# Patient Record
Sex: Male | Born: 1985 | Race: Black or African American | Hispanic: No | Marital: Single | Smoking: Never smoker
Health system: Southern US, Community
[De-identification: ages and names within clinical notes are randomized; demographics above are authoritative.]

## PROBLEM LIST (undated history)

## (undated) DIAGNOSIS — A539 Syphilis, unspecified: Secondary | ICD-10-CM

---
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
## 2005-01-11 ENCOUNTER — Emergency Department (HOSPITAL_COMMUNITY): Admission: EM | Admit: 2005-01-11 | Discharge: 2005-01-11 | Payer: Self-pay | Admitting: Family Medicine

## 2007-03-01 ENCOUNTER — Emergency Department (HOSPITAL_COMMUNITY): Admission: EM | Admit: 2007-03-01 | Discharge: 2007-03-01 | Payer: Self-pay | Admitting: Emergency Medicine

## 2007-03-02 ENCOUNTER — Emergency Department (HOSPITAL_COMMUNITY): Admission: EM | Admit: 2007-03-02 | Discharge: 2007-03-03 | Payer: Self-pay | Admitting: Emergency Medicine
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
## 2010-04-12 ENCOUNTER — Emergency Department (HOSPITAL_COMMUNITY)
Admission: EM | Admit: 2010-04-12 | Discharge: 2010-04-12 | Disposition: A | Discharge status: Home / Self Care | Payer: Self-pay | Attending: Emergency Medicine | Admitting: Emergency Medicine

## 2010-04-12 DIAGNOSIS — R6883 Chills (without fever): Secondary | ICD-10-CM | POA: Insufficient documentation

## 2010-04-12 DIAGNOSIS — R197 Diarrhea, unspecified: Secondary | ICD-10-CM | POA: Insufficient documentation

## 2010-04-12 DIAGNOSIS — R110 Nausea: Secondary | ICD-10-CM | POA: Insufficient documentation

## 2010-04-12 DIAGNOSIS — B9789 Other viral agents as the cause of diseases classified elsewhere: Secondary | ICD-10-CM | POA: Insufficient documentation

## 2014-10-02 ENCOUNTER — Encounter (HOSPITAL_COMMUNITY): Payer: Self-pay | Admitting: General Practice

## 2014-10-02 ENCOUNTER — Emergency Department (HOSPITAL_COMMUNITY)
Admission: EM | Admit: 2014-10-02 | Discharge: 2014-10-02 | Disposition: A | Discharge status: Home / Self Care | Payer: Worker's Compensation | Attending: Emergency Medicine | Admitting: Emergency Medicine

## 2014-10-02 DIAGNOSIS — T304 Corrosion of unspecified body region, unspecified degree: Secondary | ICD-10-CM

## 2014-10-02 DIAGNOSIS — Y998 Other external cause status: Secondary | ICD-10-CM | POA: Insufficient documentation

## 2014-10-02 DIAGNOSIS — Y9289 Other specified places as the place of occurrence of the external cause: Secondary | ICD-10-CM | POA: Insufficient documentation

## 2014-10-02 DIAGNOSIS — T6591XA Toxic effect of unspecified substance, accidental (unintentional), initial encounter: Secondary | ICD-10-CM | POA: Insufficient documentation

## 2014-10-02 DIAGNOSIS — W38XXXA Explosion and rupture of other specified pressurized devices, initial encounter: Secondary | ICD-10-CM | POA: Diagnosis not present

## 2014-10-02 DIAGNOSIS — T20411A Corrosion of unspecified degree of right ear [any part, except ear drum], initial encounter: Secondary | ICD-10-CM | POA: Diagnosis present

## 2014-10-02 DIAGNOSIS — T22512A Corrosion of first degree of left forearm, initial encounter: Secondary | ICD-10-CM | POA: Insufficient documentation

## 2014-10-02 DIAGNOSIS — Z23 Encounter for immunization: Secondary | ICD-10-CM | POA: Insufficient documentation

## 2014-10-02 DIAGNOSIS — Y9389 Activity, other specified: Secondary | ICD-10-CM | POA: Insufficient documentation

## 2014-10-02 MED ORDER — OXYCODONE-ACETAMINOPHEN 5-325 MG PO TABS: 1.0000 | ORAL_TABLET | ORAL | Status: AC | PRN

## 2014-10-02 MED ORDER — TETANUS-DIPHTH-ACELL PERTUSSIS 5-2.5-18.5 LF-MCG/0.5 IM SUSP
0.5000 mL | Freq: Once | INTRAMUSCULAR | Status: AC
  Administered 2014-10-02: 0.5 mL via INTRAMUSCULAR
  Filled 2014-10-02: qty 0.5

## 2014-10-02 MED ORDER — OXYCODONE-ACETAMINOPHEN 5-325 MG PO TABS
2.0000 | ORAL_TABLET | Freq: Once | ORAL | Status: AC
  Administered 2014-10-02: 2 via ORAL
  Filled 2014-10-02: qty 2

## 2014-10-02 MED ORDER — DOUBLE ANTIBIOTIC 500-10000 UNIT/GM EX OINT
TOPICAL_OINTMENT | Freq: Two times a day (BID) | CUTANEOUS | Status: DC
  Filled 2014-10-02 (×18): qty 1

## 2014-10-02 MED ORDER — DOUBLE ANTIBIOTIC 500-10000 UNIT/GM EX OINT
TOPICAL_OINTMENT | Freq: Two times a day (BID) | CUTANEOUS | Status: DC
  Administered 2014-10-02: 1 via TOPICAL
  Filled 2014-10-02: qty 14.17
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
## 2014-10-02 NOTE — Discharge Instructions (Signed)
Please follow-up with your work physician or private physician for recheck in 2 days.  Chemical Burn Many chemicals can burn the skin. A chemical burn should be flushed with cool water and checked by an emergency caregiver. Your skin is a natural barrier to infection. It is the largest organ of your body. Burns damage this natural protection. To help prevent infection, it is very important to follow your caregiver's instructions in the care of your burn.  Many industrial chemicals may cause burns. These chemicals include acids, alkalis, and organic compounds such as petroleum, phenol, bitumen, tar, and grease. When acids come in contact with the skin, they cause an immediate change in the skin.Acid burns produce significant pain and form a scab (eschar). Usually, the immediate skin changes are the only damage from an acid burn.However, exposure to formic acid, chromic acid, or hydrofluoric acid may affect the whole body and may even be life-threatening. Alkalis include lye, cement, lime, and many chemicals with "hydroxide" in their name.An alkali burn may be less apparent than an acid burn at first. However, alkalis may cause greater tissue damage.It is important to be aware of any chemicals you are using. Treat any exposure to skin, eyes, or mucous membranes (nose, mouth, throat) as a potential emergency. PREVENTION  Avoid exposure to toxic chemicals that can cause burns.  Store chemicals out of the reach of children.  Use protective gloves when handling dangerous chemicals. HOME CARE INSTRUCTIONS   Wash your hands well before changing your bandage.  Change your bandage as often as directed by your caregiver.  Remove the old bandage. If the bandage sticks, you may soak it off with cool, clean water.  Cleanse the burn thoroughly but gently with mild soap and water.  Pat the area dry with a clean, dry cloth.  Apply a thin layer of antibacterial cream to the burn.  Apply a clean bandage  as instructed by your caregiver.  Keep the bandage as clean and dry as possible.  Elevate the affected area for the first 24 hours, then as instructed by your caregiver.  Only take over-the-counter or prescription medicines for pain, discomfort, or fever as directed by your caregiver.  Keep all follow-up appointments.This is important. This is how your caregiver can tell if your treatment is working. SEEK IMMEDIATE MEDICAL CARE IF:   You develop excessive pain.  You develop redness, tenderness, swelling, or red streaks near the burn.  The burned area develops yellowish-white fluid (pus) or a bad smell.  You have a fever. MAKE SURE YOU:   Understand these instructions.  Will watch your condition.  Will get help right away if you are not doing well or get worse. Document Released: 10/09/2003 Document Revised: 03/27/2011 Document Reviewed: 05/30/2010 [facility] Patient Information 2015 [place], [place]. This information is not intended to replace advice given to you by your health care provider. Make sure you discuss any questions you have with your health care provider.
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
## 2015-12-18 ENCOUNTER — Emergency Department (HOSPITAL_COMMUNITY): Payer: Self-pay

## 2015-12-18 ENCOUNTER — Encounter (HOSPITAL_COMMUNITY): Payer: Self-pay | Admitting: Nurse Practitioner

## 2015-12-18 ENCOUNTER — Emergency Department (HOSPITAL_COMMUNITY)
Admission: EM | Admit: 2015-12-18 | Discharge: 2015-12-18 | Disposition: A | Discharge status: Home / Self Care | Payer: Self-pay | Attending: Emergency Medicine | Admitting: Emergency Medicine

## 2015-12-18 DIAGNOSIS — F419 Anxiety disorder, unspecified: Secondary | ICD-10-CM | POA: Insufficient documentation

## 2015-12-18 DIAGNOSIS — Z79899 Other long term (current) drug therapy: Secondary | ICD-10-CM | POA: Insufficient documentation

## 2015-12-18 HISTORY — DX: Syphilis, unspecified: A53.9

## 2015-12-18 LAB — CBC WITH DIFFERENTIAL/PLATELET
BASOS ABS: 0.1 10*3/uL (ref 0.0–0.1)
Basophils Relative: 1 %
Eosinophils Absolute: 0.5 10*3/uL (ref 0.0–0.7)
Eosinophils Relative: 7 %
HEMATOCRIT: 45.9 % (ref 39.0–52.0)
HEMOGLOBIN: 15.9 g/dL (ref 13.0–17.0)
LYMPHS PCT: 49 %
Lymphs Abs: 3.1 10*3/uL (ref 0.7–4.0)
MCH: 30.9 pg (ref 26.0–34.0)
MCHC: 34.6 g/dL (ref 30.0–36.0)
MCV: 89.3 fL (ref 78.0–100.0)
MONO ABS: 0.7 10*3/uL (ref 0.1–1.0)
MONOS PCT: 11 %
NEUTROS ABS: 2.1 10*3/uL (ref 1.7–7.7)
NEUTROS PCT: 32 %
Platelets: 298 10*3/uL (ref 150–400)
RBC: 5.14 MIL/uL (ref 4.22–5.81)
RDW: 13.2 % (ref 11.5–15.5)
WBC: 6.4 10*3/uL (ref 4.0–10.5)

## 2015-12-18 LAB — I-STAT TROPONIN, ED: Troponin i, poc: 0 ng/mL (ref 0.00–0.08)

## 2015-12-18 LAB — BASIC METABOLIC PANEL
ANION GAP: 7 (ref 5–15)
BUN: 9 mg/dL (ref 6–20)
CO2: 29 mmol/L (ref 22–32)
Calcium: 9.2 mg/dL (ref 8.9–10.3)
Chloride: 103 mmol/L (ref 101–111)
Creatinine, Ser: 1.17 mg/dL (ref 0.61–1.24)
GLUCOSE: 96 mg/dL (ref 65–99)
POTASSIUM: 4.2 mmol/L (ref 3.5–5.1)
Sodium: 139 mmol/L (ref 135–145)

## 2015-12-18 MED ORDER — LORAZEPAM 0.5 MG PO TABS
0.5000 mg | ORAL_TABLET | Freq: Once | ORAL | Status: AC
  Administered 2015-12-18: 0.5 mg via ORAL
  Filled 2015-12-18: qty 1

## 2015-12-18 MED ORDER — LORAZEPAM 1 MG PO TABS: 0.5000 mg | ORAL_TABLET | Freq: Three times a day (TID) | ORAL | 0 refills | Status: DC | PRN

## 2015-12-18 MED ORDER — LORAZEPAM 1 MG PO TABS: 0.5000 mg | ORAL_TABLET | Freq: Three times a day (TID) | ORAL | 0 refills | Status: AC | PRN

## 2015-12-18 NOTE — ED Notes (Signed)
Patient d/c'd self care.  F/U and medications reviewed.  Patient verbalized understanding. 

## 2015-12-18 NOTE — Discharge Instructions (Signed)
Therapists  Name Address  Phone Number  [facility] [address] [place] [phone]  [facility]  [address] [place] [phone]  [facility]  [address] [phone]  [facility] [address] [place] [phone]  [facility]  [address], [address] [place] [phone]  [facility] [address], [address] [phone]  [facility] (sliding Fee) [address] [place] [phone]  [facility] Maris BergerKathy Kirstner [address], [place] [phone]  [facility] (sliding fee) [address] [facility] [phone]  [facility] [address], [place] [phone]  [facility] [address], Emelda FearFerguson Building, [place] [phone]  Arbutus PedJoe Alvarez, LCSW [address], [place] [phone]  Shanon RosserBarbara Farran, [place] [address] [phone]  Vernell LeepKen Frazier, [facility]  [phone], [place] [phone]  Hurley CiscoBarbara Fousek [address] [place] [phone]  Marvene Staffarryl Hyers, PhD [address] [place] [phone]  Arrie SenateNancy King, LSW, LCAS [address] [facility]  [phone]  Windee Knox-Heitkamp [address], [place] [phone]  Lyanne CoEd Lurey, PhD [facility] [address], [place] [phone]  Glendell DockerMcMillan, Matthew, [facility], LCAS [address] [place] [phone]  Ernest McCoy, LelyLCSW, CSAC [address] [place] [phone]  Harle StanfordBarbara Metz, PhD [facility]  [address] [place] [phone]  [facility] Milan, LCSW [address] [place] [phone]  Horace PorteousGlen Newsom, PhD  [phone]  Para Marchick Pace, EDD, LMFT [facility]  [address] [place] [phone]  Evalina FieldJane Perrin, PhD  [phone], [address] [place] [phone]  Isaiah SergeJim Scherer [phone] [place] [phone]  Kem ParkinsonPatty Von Steen, PhD [address] [phone] [place] [phone]               [facility]  [facility]  [address] [place] [phone]  Miguel AschoffElaine Talbert, PhD [address], [place] [phone]  Caralyn Guileavid Gutterman [address], [place] [phone]      Psychiatrists  Name Address Phone Number Fax Number  [facility]  [address] # [id number], [place] [phone]  [facility] [address], [place] [phone]  [facility] [address], [place] [phone]   [facility] [address], [place] [phone]   [facility] [address], [place] [phone]   Dr. Leata MouseJanardhana Jonnalagadda [address], [address], [place] [phone]  [facility]  [address] [place] [phone]   Andee PolesParish McKinney, MD [address], [address], [place] [phone]  Len Blalockavid Fuller, MD [address], [place], [place] [phone]   Lerry LinerStephen Sanders, MD [address] [place] [phone]   Mar Daringobert Wangelin MD [address] [place] [phone]   Ellamae SiaKeshavpal Reddy  MD [address] [place] [phone]  Ezzard Flaxarol Sena, MD [address] [place] [phone]   [address] [place] [phone]   Phillip HealJane Steiner, MD [address], [phone] [facility] [phone]                   [facility]  [facility] The [facility] [facility], [address] [place] CannerGBO [phone]  [facility]  [address], [place] [phone]  [facility] [phone]

## 2015-12-18 NOTE — ED Triage Notes (Signed)
Pt c/o a sudden onset of right sided sharp chest pain radiating to the right shoulder. Denies cardiopulmonary hx or any other significant medical hx.

## 2015-12-18 NOTE — ED Provider Notes (Signed)
[facility]-EMER[facility]Y DEPT Provider Note   CSN: [id number] Arrival date & time: 12/18/15  1935  By signing my name below, I, Dan Leach, attest that this documentation has been prepared under the direction and in the presence of Dan Peliffany Revis Whalin, PA-C. Electronically Signed: Doreatha MartinEva Leach, ED Scribe. 12/18/15. 8:59 PM.    History   Chief Complaint Chief Complaint  Patient presents with  . Chest Pain    HPI Dan Leach is a 30 y.o. male who presents to the Emergency Department complaining of an episode of moderate, sharp right-sided CP with radiation to the right shoulder that began this evening and lasted 10 seconds. Per pt, his pain was rated as a 7/10 when it began, and has now resolved. Pt states he initially experienced an episode of shaking to his torso and hands yesterday while at a work orientation, which recurred this evening just prior to onset of CP. Pt reports no h/o similar shaking. He denies excessive caffeine consumption or sudden cessation of caffeine. He does note some anxiety related to his recent separation with his wife. The patient becomes tearful during the conversation and admits that he is under a lot of stress and him and his separated wife are trying to resolve issues. He has not had any SOB, back pain, diaphoresis, syncope, weakness, fevers, URI symptoms, LE swelling. He denies SI, HI.   The history is provided by the patient. No language interpreter was used.    Past Medical History:  Diagnosis Date  . Syphilis     There are no active problems to display for this patient.   History reviewed. No pertinent surgical history.     Home Medications    Prior to Admission medications   Medication Sig Start Date End Date Taking? Authorizing Provider  LORazepam (ATIVAN) 1 MG tablet Take 0.5-1 tablets (0.5-1 mg total) by mouth 3 (three) times daily as needed for anxiety. 12/18/15   Dan Peliffany Iktan Aikman, PA-C  oxyCODONE-acetaminophen (PERCOCET/ROXICET) 5-325 MG per tablet Take  1 tablet by mouth every 4 (four) hours as needed for severe pain. Patient not taking: Reported on 12/18/2015 10/02/14   [name], MD    Family History History reviewed. No pertinent family history.  Social History Social History  Substance Use Topics  . Smoking status: Never Smoker  . Smokeless tobacco: Never Used  . Alcohol use No     Allergies   Patient has no known allergies.   Review of Systems Review of Systems  Constitutional:       +shaking  Cardiovascular: Positive for chest pain.  Psychiatric/Behavioral: Negative for suicidal ideas. The patient is nervous/anxious.   All other systems reviewed and are negative.   Physical Exam Updated Vital Signs BP 134/92 (BP Location: Left Arm)   Pulse (!) 55   Temp 97.9 F (36.6 C) (Oral)   Resp 18   SpO2 98%   Physical Exam  Constitutional: He appears well-developed and well-nourished. No distress.  HENT:  Head: Normocephalic and atraumatic.  Right Ear: Tympanic membrane and ear canal normal.  Left Ear: Tympanic membrane and ear canal normal.  Nose: Nose normal.  Mouth/Throat: Uvula is midline, oropharynx is clear and moist and mucous membranes are normal.  Eyes: Conjunctivae are normal. Pupils are equal, round, and reactive to light.  Neck: Normal range of motion. Neck supple.  Cardiovascular: Normal rate and regular rhythm.   Pulmonary/Chest: Effort normal. No respiratory distress.  Abdominal: Soft. He exhibits no distension.  No signs of abdominal distention  Musculoskeletal: Normal  range of motion.  No LE swelling  Neurological: He is alert.  Acting at baseline  Skin: Skin is warm and dry. No rash noted.  Psychiatric: His behavior is normal. His mood appears anxious. He exhibits a depressed mood.  + tearful. Tremulous resolved after Ativan.   Nursing note and vitals reviewed.    ED Treatments / Results   DIAGNOSTIC STUDIES: Oxygen Saturation is 98% on RA, normal by my interpretation.     COORDINATION OF CARE: 8:57 PM Discussed treatment plan with pt at bedside which includes lab work, CXR and pt agreed to plan.    Labs (all labs ordered are listed, but only abnormal results are displayed) Labs Reviewed  BASIC METABOLIC PANEL  CBC WITH DIFFERENTIAL/PLATELET  Rosezena Sensor-STAT TROPOININ, ED    EKG  EKG Interpretation  Date/Time:  Saturday December 18 2015 19:50:38 EST Ventricular Rate:  78 PR Interval:    QRS Duration: 71 QT Interval:  366 QTC Calculation: 417 R Axis:   69 Text Interpretation:  Sinus rhythm RSR' in V1 or V2, probably normal variant Borderline T wave abnormalities ST elev, probable normal early repol pattern No old tracing to compare Confirmed by [facility]  MD, ELLIOTT [phone]([phone]) on 12/18/2015 9:08:25 PM       Radiology Dg Chest 2 View  Result Date: 12/18/2015 CLINICAL DATA:  Dan Leach right-sided chest pain extending to the right shoulder EXAM: CHEST  2 VIEW COMPARISON:  None. FINDINGS: The heart size and mediastinal contours are within normal limits. Both lungs are clear. The visualized skeletal structures are unremarkable. IMPRESSION: Negative two view chest x-ray Electronically Signed   By: Dan Robertshristopher  Leach M.D.   On: 12/18/2015 21:08    Procedures Procedures (including critical care time)  Medications Ordered in ED Medications  LORazepam (ATIVAN) tablet 0.5 mg (0.5 mg Oral Given 12/18/15 2118)     Initial Impression / Assessment and Plan / ED Course  I have reviewed the triage vital signs and the nursing notes.  Pertinent labs & imaging results that were available during my care of the patient were reviewed by me and considered in my medical decision making (see chart for details).  Clinical Course     Patients pain was very atypical and associated with shaking/tremors, significant anxiety. I gave him a dose of Ativan in the ED, his tremors resolved and he reports feeling a bit more calm. He has a normal chest xray, neg troponin and CBC and BMP  are unremarkable.   Pt given a small rx for Ativan, [facility] and referrals to psych in the area.  Final Clinical Impressions(s) / ED Diagnoses   Final diagnoses:  Anxiety    New Prescriptions New Prescriptions   LORAZEPAM (ATIVAN) 1 MG TABLET    Take 0.5-1 tablets (0.5-1 mg total) by mouth 3 (three) times daily as needed for anxiety.     I personally performed the services described in this documentation, which was scribed in my presence. The recorded information has been reviewed and is accurate.    Dan Peliffany Severus Brodzinski, PA-C 12/18/15 2213    Gerhard Munchobert Lockwood, MD 12/20/15 [id number]

## 2018-02-04 IMAGING — CR DG CHEST 2V
2 series · 2 of 2 positions shown · non-contrast
Comparison: None.

CLINICAL DATA: Sharp right-sided chest pain extending to the right
shoulder

EXAM:
CHEST  2 VIEW

[w chest pa]
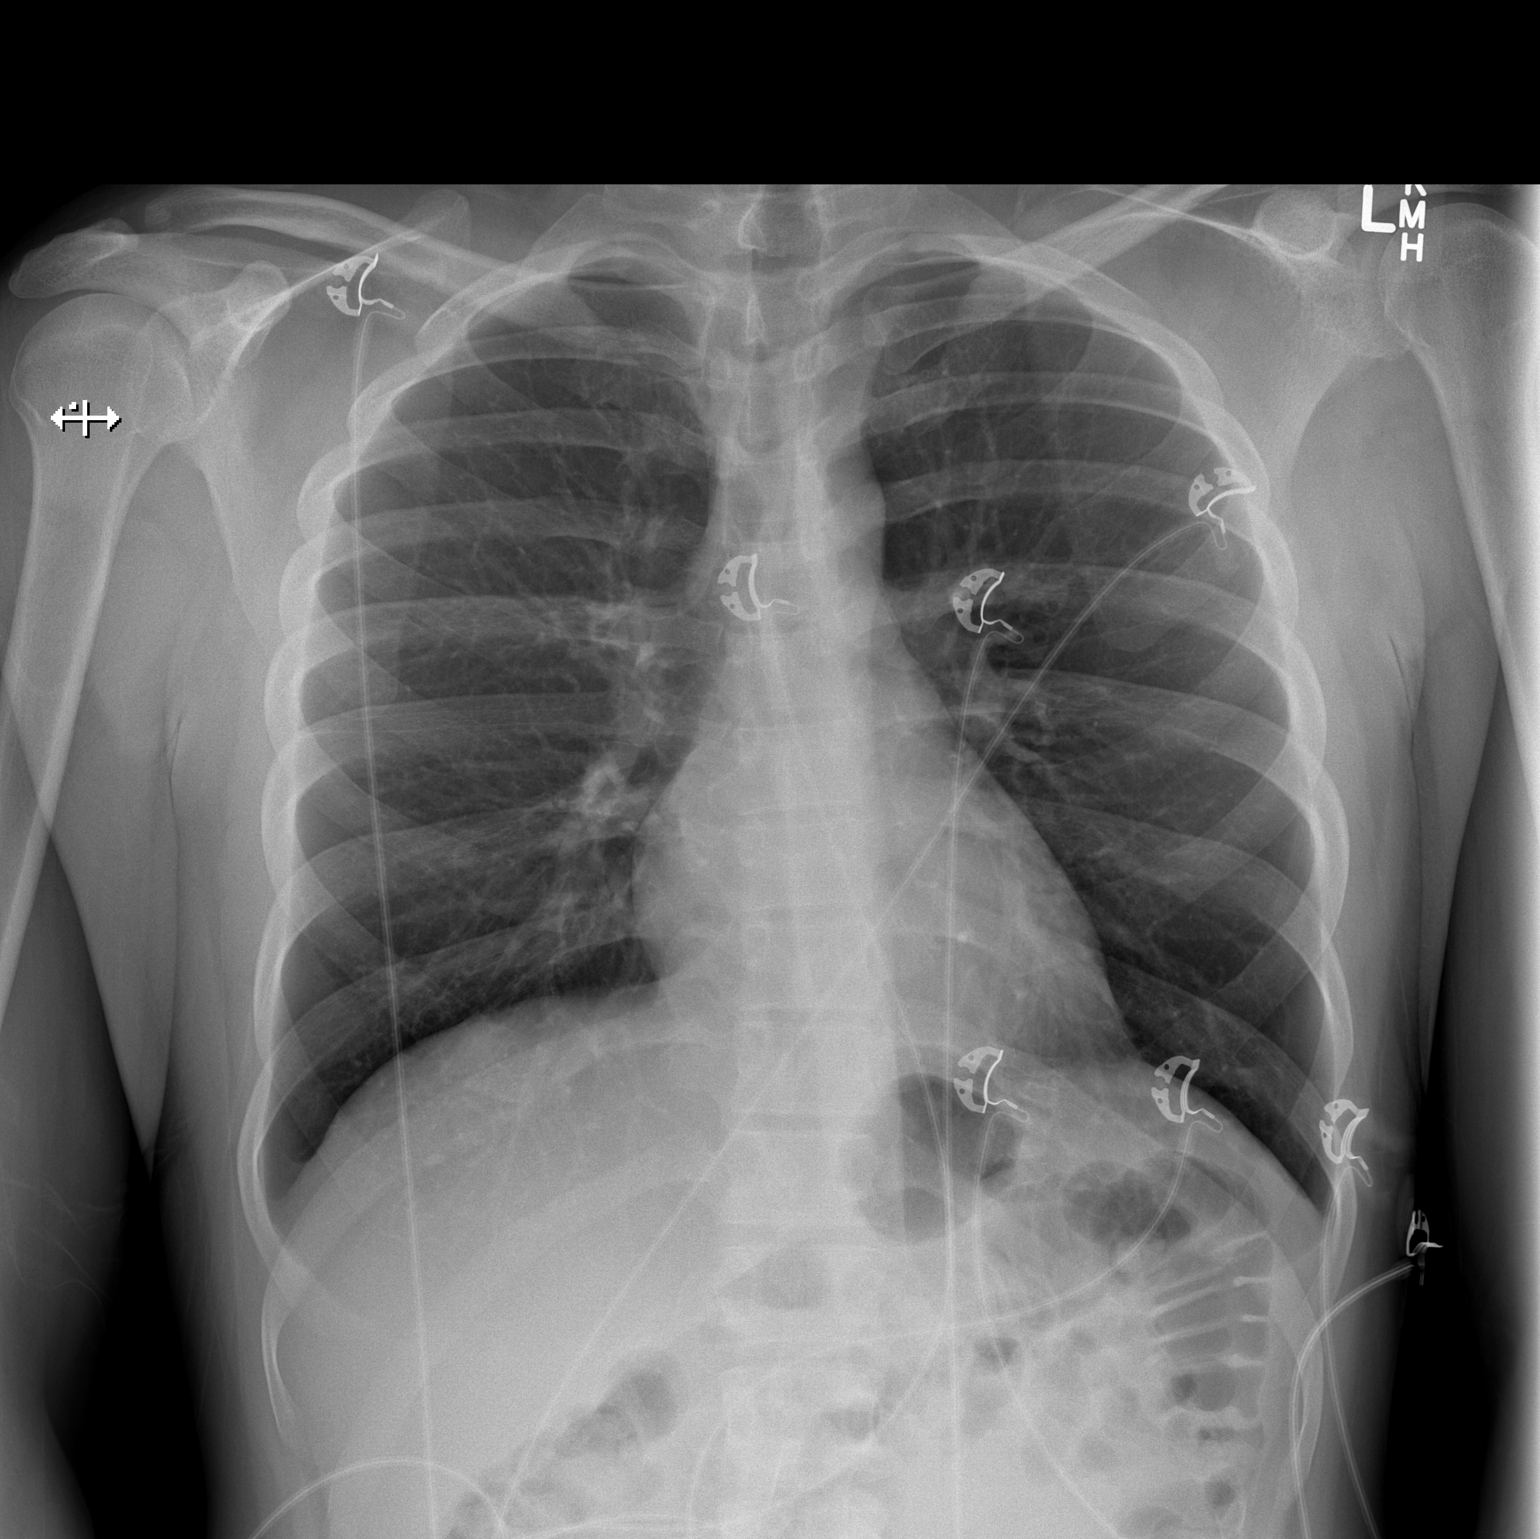

[w chest lat]
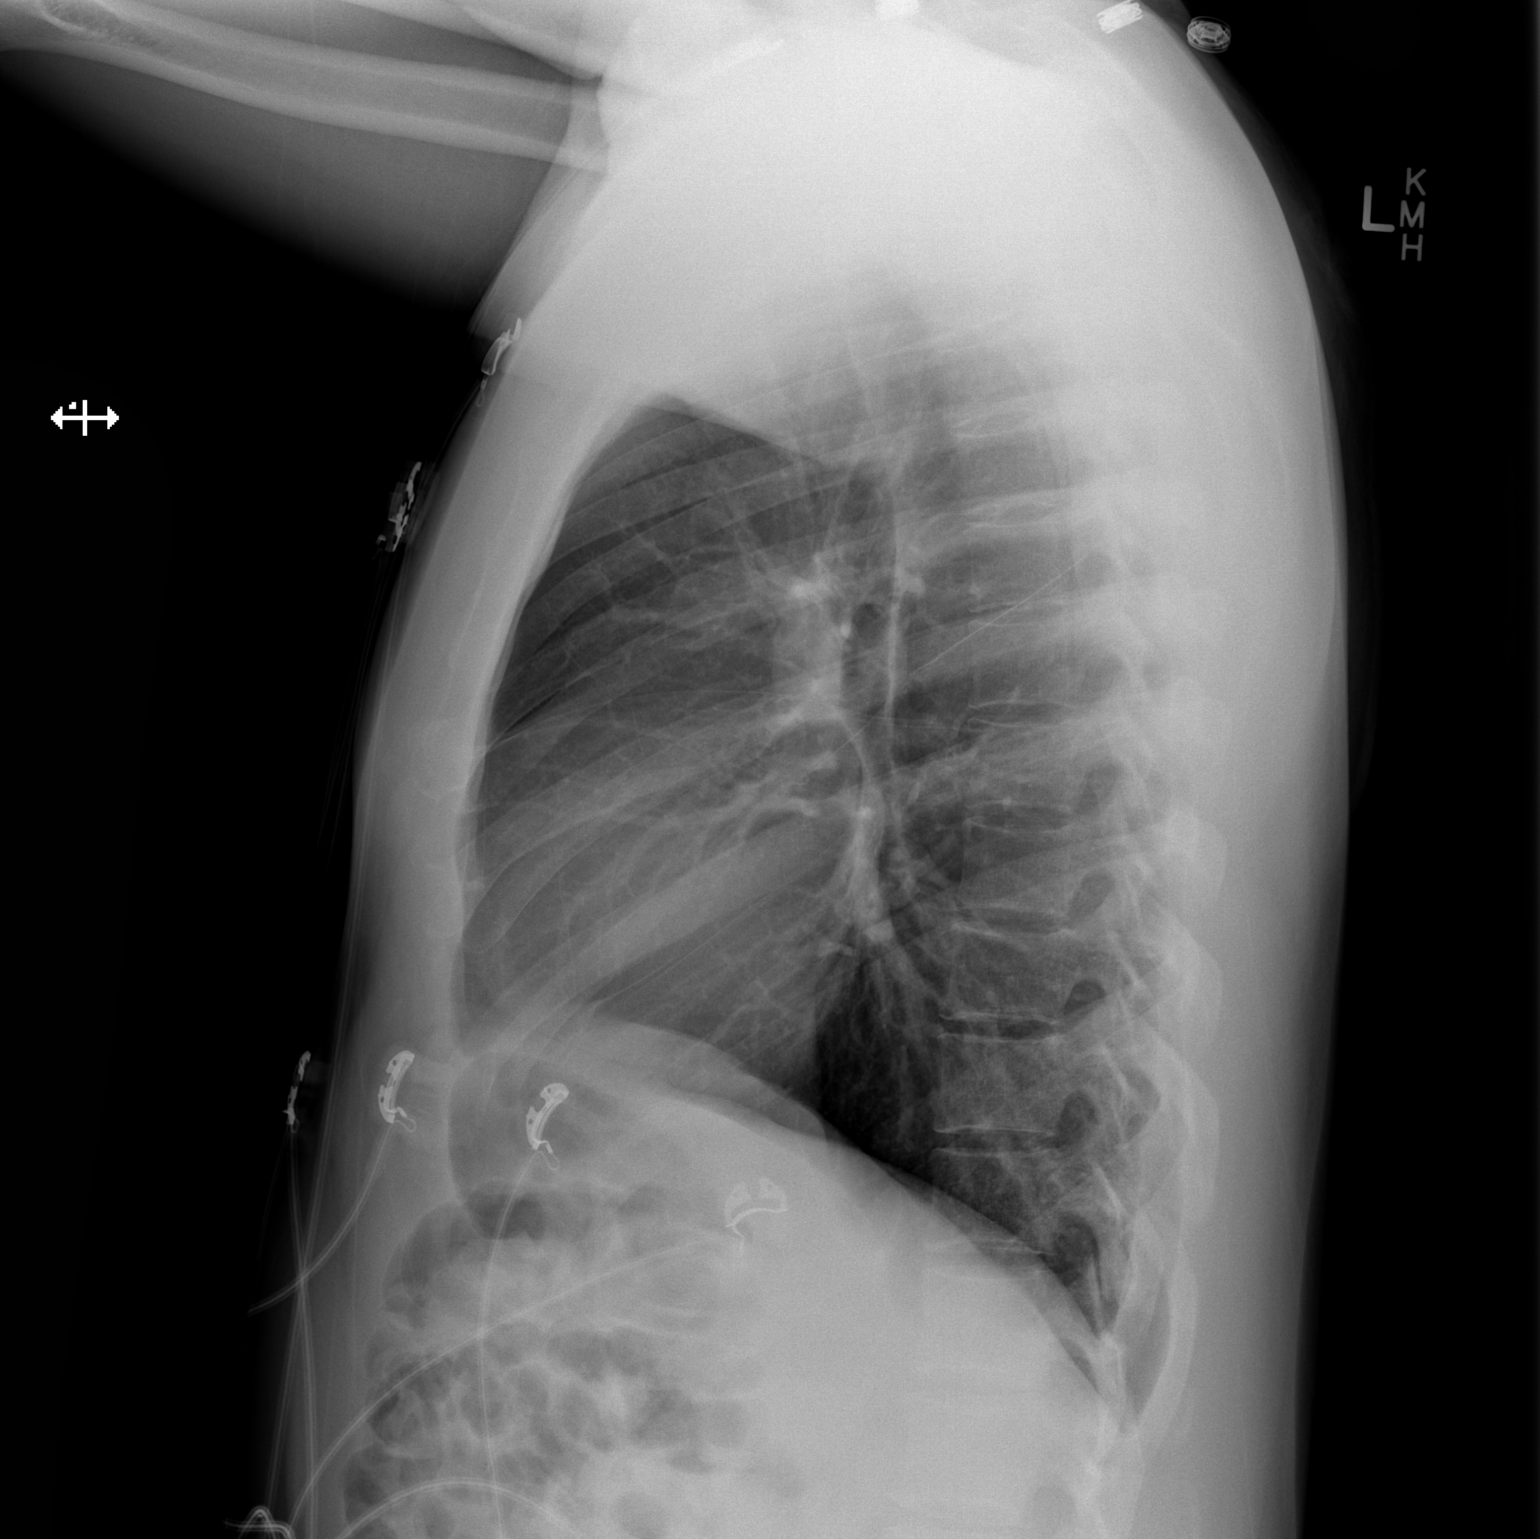

[2 of 2 positions shown; findings below may reference images not displayed]

FINDINGS: The heart size and mediastinal contours are within normal limits.
Both lungs are clear. The visualized skeletal structures are
unremarkable.
IMPRESSION: Negative two view chest x-ray
